# Patient Record
Sex: Female | Born: 1965 | Race: Black or African American | Hispanic: No | Marital: Married | Smoking: Former smoker
Health system: Southern US, Community
[De-identification: ages and names within clinical notes are randomized; demographics above are authoritative.]

## PROBLEM LIST (undated history)

## (undated) DIAGNOSIS — I10 Essential (primary) hypertension: Secondary | ICD-10-CM

## (undated) DIAGNOSIS — M5136 Other intervertebral disc degeneration, lumbar region: Secondary | ICD-10-CM

## (undated) DIAGNOSIS — E785 Hyperlipidemia, unspecified: Secondary | ICD-10-CM

## (undated) HISTORY — PX: ABDOMINAL HYSTERECTOMY: SHX81

## (undated) HISTORY — PX: DILATION AND CURETTAGE OF UTERUS: SHX78

## (undated) HISTORY — PX: OTHER SURGICAL HISTORY: SHX169

---

## 2000-02-28 ENCOUNTER — Encounter: Payer: Self-pay | Admitting: *Deleted

## 2000-02-28 ENCOUNTER — Inpatient Hospital Stay (HOSPITAL_COMMUNITY): Admission: AD | Admit: 2000-02-28 | Discharge: 2000-02-28 | Payer: Self-pay | Admitting: *Deleted

## 2000-03-15 ENCOUNTER — Other Ambulatory Visit: Admission: RE | Admit: 2000-03-15 | Discharge: 2000-03-15 | Payer: Self-pay | Admitting: Obstetrics

## 2000-03-20 ENCOUNTER — Ambulatory Visit (HOSPITAL_COMMUNITY): Admission: RE | Admit: 2000-03-20 | Discharge: 2000-03-20 | Payer: Self-pay | Admitting: Obstetrics

## 2000-03-20 ENCOUNTER — Encounter: Payer: Self-pay | Admitting: Obstetrics

## 2000-07-11 ENCOUNTER — Inpatient Hospital Stay (HOSPITAL_COMMUNITY): Admission: AD | Admit: 2000-07-11 | Discharge: 2000-07-11 | Payer: Self-pay | Admitting: Obstetrics

## 2014-07-23 ENCOUNTER — Other Ambulatory Visit: Payer: Self-pay | Admitting: Neurosurgery

## 2014-07-27 NOTE — Pre-Procedure Instructions (Signed)
Gloria SlickerConstance H Bryan  07/27/2014   Your procedure is scheduled on:  Friday, January 29th  [facility] at 10 AM.  Call this number if you have problems the morning of surgery: [phone]   Remember:   Do not eat food or drink liquids after midnight.   Take these medicines the morning of surgery with A SIP OF WATER:      Do not wear jewelry, make-up or nail polish.  Do not wear lotions, powders, or perfumes, deodorant.  Do not shave 48 hours prior to surgery. Men may shave face and neck.  Do not bring valuables to the hospital.  [facility]  for any belongings or valuables.               Contacts, dentures or bridgework may not be worn into surgery.  Leave suitcase in the car. After surgery it may be brought to your room.  For patients admitted to the hospital, discharge time is determined by you treatment team.               Patients discharged the day of surgery will not be allowed to drive home.   Please read over the following fact sheets that you were given: Pain Booklet, Coughing and Deep Breathing, MRSA Information and Surgical Site Infection Prevention  [place] - Preparing for Surgery  Before surgery, you can play an important role.  Because skin is not sterile, your skin needs to be as free of germs as possible.  You can reduce the number of germs on you skin by washing with CHG (chlorahexidine gluconate) soap before surgery.  CHG is an antiseptic cleaner which kills germs and bonds with the skin to continue killing germs even after washing.  Please DO NOT use if you have an allergy to CHG or antibacterial soaps.  If your skin becomes reddened/irritated stop using the CHG and inform your nurse when you arrive at Short Stay.  Do not shave (including legs and underarms) for at least 48 hours prior to the first CHG shower.  You may shave your face.  Please follow these instructions carefully:   1.  Shower with CHG Soap  the night before surgery and the morning of Surgery.  2.  If you choose to wash your hair, wash your hair first as usual with your normal shampoo.  3.  After you shampoo, rinse your hair and body thoroughly to remove the shampoo.  4.  Use CHG as you would any other liquid soap.  You can apply CHG directly to the skin and wash gently with scrungie or a clean washcloth.  5.  Apply the CHG Soap to your body ONLY FROM THE NECK DOWN.  Do not use on open wounds or open sores.  Avoid contact with your eyes, ears, mouth and genitals (private parts).  Wash genitals (private parts) with your normal soap.  6.  Wash thoroughly, paying special attention to the area where your surgery will be performed.  7.  Thoroughly rinse your body with warm water from the neck down.  8.  DO NOT shower/wash with your normal soap after using and rinsing off the CHG Soap.  9.  Pat yourself dry with a clean towel.            10.  Wear clean pajamas.            11.  Place clean sheets on your bed the night of your  first shower and do not sleep with pets.  Day of Surgery  Do not apply any lotions/deoderants the morning of surgery.  Please wear clean clothes to the hospital/surgery center.

## 2014-07-28 ENCOUNTER — Encounter (HOSPITAL_COMMUNITY)
Admission: RE | Admit: 2014-07-28 | Discharge: 2014-07-28 | Disposition: A | Discharge status: Home / Self Care | Payer: Managed Care, Other (non HMO) | Source: Ambulatory Visit | Attending: Neurosurgery | Admitting: Neurosurgery

## 2014-07-28 ENCOUNTER — Encounter (HOSPITAL_COMMUNITY): Payer: Self-pay

## 2014-07-28 DIAGNOSIS — M5020 Other cervical disc displacement, unspecified cervical region: Secondary | ICD-10-CM | POA: Diagnosis not present

## 2014-07-28 DIAGNOSIS — Z01810 Encounter for preprocedural cardiovascular examination: Secondary | ICD-10-CM | POA: Diagnosis not present

## 2014-07-28 DIAGNOSIS — Z01812 Encounter for preprocedural laboratory examination: Secondary | ICD-10-CM | POA: Insufficient documentation

## 2014-07-28 HISTORY — DX: Hyperlipidemia, unspecified: E78.5

## 2014-07-28 HISTORY — DX: Other intervertebral disc degeneration, lumbar region: M51.36

## 2014-07-28 HISTORY — DX: Essential (primary) hypertension: I10

## 2014-07-28 LAB — BASIC METABOLIC PANEL
Anion gap: 9 (ref 5–15)
BUN: 11 mg/dL (ref 6–23)
CALCIUM: 9.3 mg/dL (ref 8.4–10.5)
CHLORIDE: 109 mmol/L (ref 96–112)
CO2: 23 mmol/L (ref 19–32)
Creatinine, Ser: 0.63 mg/dL (ref 0.50–1.10)
GFR calc Af Amer: >90 mL/min (ref >90)
GFR calc non Af Amer: >90 mL/min (ref >90)
GLUCOSE: 109 mg/dL — AB (ref 70–99)
Potassium: 3.9 mmol/L (ref 3.5–5.1)
SODIUM: 141 mmol/L (ref 135–145)

## 2014-07-28 LAB — CBC WITH DIFFERENTIAL/PLATELET
BASOS PCT: 0 % (ref 0–1)
Basophils Absolute: 0 10*3/uL (ref 0.0–0.1)
EOS ABS: 0.1 10*3/uL (ref 0.0–0.7)
EOS PCT: 1 % (ref 0–5)
HEMATOCRIT: 39.8 % (ref 36.0–46.0)
Hemoglobin: 13.6 g/dL (ref 12.0–15.0)
Lymphocytes Relative: 37 % (ref 12–46)
Lymphs Abs: 2.5 10*3/uL (ref 0.7–4.0)
MCH: 31.8 pg (ref 26.0–34.0)
MCHC: 34.2 g/dL (ref 30.0–36.0)
MCV: 93 fL (ref 78.0–100.0)
MONO ABS: 0.3 10*3/uL (ref 0.1–1.0)
Monocytes Relative: 5 % (ref 3–12)
NEUTROS PCT: 57 % (ref 43–77)
Neutro Abs: 3.9 10*3/uL (ref 1.7–7.7)
Platelets: 234 10*3/uL (ref 150–400)
RBC: 4.28 MIL/uL (ref 3.87–5.11)
RDW: 12.6 % (ref 11.5–15.5)
WBC: 6.8 10*3/uL (ref 4.0–10.5)

## 2014-07-28 LAB — SURGICAL PCR SCREEN
MRSA, PCR: NEGATIVE
STAPHYLOCOCCUS AUREUS: NEGATIVE

## 2014-07-28 NOTE — Progress Notes (Signed)
Primary - dr. Philmore Palimatthew philip No cardiologist No prior cardiac testing  Patient unable to afford medications. So does not currently take any prescribed medications

## 2014-07-31 NOTE — Progress Notes (Signed)
Spoke with pt and informed her of surgical time change and to arrive at 0900.  Reviewed pre op instructions with state understanding.

## 2014-08-02 MED ORDER — CEFAZOLIN SODIUM-DEXTROSE 2-3 GM-% IV SOLR
2.0000 g | INTRAVENOUS | Status: AC
  Administered 2014-08-03: 2 g via INTRAVENOUS
  Filled 2014-08-02: qty 50

## 2014-08-02 MED ORDER — DEXAMETHASONE SODIUM PHOSPHATE 10 MG/ML IJ SOLN
10.0000 mg | INTRAMUSCULAR | Status: AC
  Administered 2014-08-03: 10 mg via INTRAVENOUS
  Filled 2014-08-02: qty 1

## 2014-08-03 ENCOUNTER — Encounter (HOSPITAL_COMMUNITY): Payer: Self-pay | Admitting: *Deleted

## 2014-08-03 ENCOUNTER — Inpatient Hospital Stay (HOSPITAL_COMMUNITY): Payer: Worker's Compensation | Admitting: Certified Registered Nurse Anesthetist

## 2014-08-03 ENCOUNTER — Inpatient Hospital Stay (HOSPITAL_COMMUNITY)
Admission: RE | Admit: 2014-08-03 | Discharge: 2014-08-04 | DRG: 473 | Disposition: A | Discharge status: Home / Self Care | Payer: Worker's Compensation | Source: Ambulatory Visit | Attending: Neurosurgery | Admitting: Neurosurgery

## 2014-08-03 ENCOUNTER — Inpatient Hospital Stay (HOSPITAL_COMMUNITY): Payer: Worker's Compensation

## 2014-08-03 ENCOUNTER — Encounter (HOSPITAL_COMMUNITY)
Admission: RE | Disposition: A | Discharge status: Home / Self Care | Payer: Self-pay | Source: Ambulatory Visit | Attending: Neurosurgery

## 2014-08-03 DIAGNOSIS — I10 Essential (primary) hypertension: Secondary | ICD-10-CM | POA: Diagnosis present

## 2014-08-03 DIAGNOSIS — M4712 Other spondylosis with myelopathy, cervical region: Secondary | ICD-10-CM | POA: Diagnosis present

## 2014-08-03 DIAGNOSIS — M4802 Spinal stenosis, cervical region: Secondary | ICD-10-CM | POA: Diagnosis present

## 2014-08-03 DIAGNOSIS — Z87891 Personal history of nicotine dependence: Secondary | ICD-10-CM | POA: Diagnosis not present

## 2014-08-03 DIAGNOSIS — E785 Hyperlipidemia, unspecified: Secondary | ICD-10-CM | POA: Diagnosis present

## 2014-08-03 DIAGNOSIS — M542 Cervicalgia: Secondary | ICD-10-CM | POA: Diagnosis present

## 2014-08-03 DIAGNOSIS — M4322 Fusion of spine, cervical region: Secondary | ICD-10-CM

## 2014-08-03 HISTORY — PX: ANTERIOR CERVICAL DECOMPRESSION/DISCECTOMY FUSION 4 LEVELS: SHX5556

## 2014-08-03 SURGERY — ANTERIOR CERVICAL DECOMPRESSION/DISCECTOMY FUSION 4 LEVELS
Anesthesia: General | Site: Neck

## 2014-08-03 MED ORDER — LACTATED RINGERS IV SOLN
INTRAVENOUS | Status: DC
  Administered 2014-08-03: 10:00:00 via INTRAVENOUS

## 2014-08-03 MED ORDER — HYDROMORPHONE HCL 1 MG/ML IJ SOLN
0.5000 mg | INTRAMUSCULAR | Status: DC | PRN
  Administered 2014-08-03: 1 mg via INTRAVENOUS
  Filled 2014-08-03: qty 1

## 2014-08-03 MED ORDER — ARTIFICIAL TEARS OP OINT
TOPICAL_OINTMENT | OPHTHALMIC | Status: DC | PRN
  Administered 2014-08-03: 1 via OPHTHALMIC

## 2014-08-03 MED ORDER — SUCCINYLCHOLINE CHLORIDE 20 MG/ML IJ SOLN
INTRAMUSCULAR | Status: AC
  Filled 2014-08-03: qty 1

## 2014-08-03 MED ORDER — PROPOFOL 10 MG/ML IV BOLUS
INTRAVENOUS | Status: AC
  Filled 2014-08-03: qty 20

## 2014-08-03 MED ORDER — STERILE WATER FOR INJECTION IJ SOLN
INTRAMUSCULAR | Status: AC
  Filled 2014-08-03: qty 10

## 2014-08-03 MED ORDER — PROMETHAZINE HCL 25 MG/ML IJ SOLN: 6.2500 mg | INTRAMUSCULAR | Status: DC | PRN

## 2014-08-03 MED ORDER — THROMBIN 20000 UNITS EX KIT
PACK | CUTANEOUS | Status: DC | PRN
  Administered 2014-08-03: 20 mL via TOPICAL

## 2014-08-03 MED ORDER — MENTHOL 3 MG MT LOZG: 1.0000 | LOZENGE | OROMUCOSAL | Status: DC | PRN

## 2014-08-03 MED ORDER — SODIUM CHLORIDE 0.9 % IJ SOLN
3.0000 mL | Freq: Two times a day (BID) | INTRAMUSCULAR | Status: DC
  Administered 2014-08-03 (×2): 3 mL via INTRAVENOUS

## 2014-08-03 MED ORDER — CEFAZOLIN SODIUM 1-5 GM-% IV SOLN
1.0000 g | Freq: Three times a day (TID) | INTRAVENOUS | Status: AC
  Administered 2014-08-03 – 2014-08-04 (×2): 1 g via INTRAVENOUS
  Filled 2014-08-03 (×2): qty 50

## 2014-08-03 MED ORDER — PHENYLEPHRINE HCL 10 MG/ML IJ SOLN
INTRAMUSCULAR | Status: DC | PRN
  Administered 2014-08-03: 120 ug via INTRAVENOUS
  Administered 2014-08-03 (×2): 80 ug via INTRAVENOUS
  Administered 2014-08-03: 120 ug via INTRAVENOUS

## 2014-08-03 MED ORDER — GLYCOPYRROLATE 0.2 MG/ML IJ SOLN
INTRAMUSCULAR | Status: AC
  Filled 2014-08-03: qty 3

## 2014-08-03 MED ORDER — LIDOCAINE HCL (CARDIAC) 20 MG/ML IV SOLN
INTRAVENOUS | Status: AC
  Filled 2014-08-03: qty 5

## 2014-08-03 MED ORDER — VECURONIUM BROMIDE 10 MG IV SOLR
INTRAVENOUS | Status: AC
  Filled 2014-08-03: qty 10

## 2014-08-03 MED ORDER — MIDAZOLAM HCL 2 MG/2ML IJ SOLN
INTRAMUSCULAR | Status: AC
  Filled 2014-08-03: qty 2

## 2014-08-03 MED ORDER — LIDOCAINE HCL (CARDIAC) 20 MG/ML IV SOLN
INTRAVENOUS | Status: DC | PRN
  Administered 2014-08-03: 100 mg via INTRAVENOUS

## 2014-08-03 MED ORDER — CYCLOBENZAPRINE HCL 10 MG PO TABS
10.0000 mg | ORAL_TABLET | Freq: Three times a day (TID) | ORAL | Status: DC | PRN
  Administered 2014-08-03 – 2014-08-04 (×3): 10 mg via ORAL
  Filled 2014-08-03 (×2): qty 1

## 2014-08-03 MED ORDER — OXYCODONE-ACETAMINOPHEN 5-325 MG PO TABS
1.0000 | ORAL_TABLET | ORAL | Status: DC | PRN
  Administered 2014-08-03 – 2014-08-04 (×3): 2 via ORAL
  Filled 2014-08-03 (×3): qty 2

## 2014-08-03 MED ORDER — MIDAZOLAM HCL 5 MG/5ML IJ SOLN
INTRAMUSCULAR | Status: DC | PRN
  Administered 2014-08-03: 2 mg via INTRAVENOUS

## 2014-08-03 MED ORDER — KETOROLAC TROMETHAMINE 30 MG/ML IJ SOLN
INTRAMUSCULAR | Status: AC
  Filled 2014-08-03: qty 1

## 2014-08-03 MED ORDER — OXYCODONE HCL 5 MG/5ML PO SOLN: 5.0000 mg | Freq: Once | ORAL | Status: AC | PRN

## 2014-08-03 MED ORDER — CYCLOBENZAPRINE HCL 10 MG PO TABS
ORAL_TABLET | ORAL | Status: AC
  Filled 2014-08-03: qty 1

## 2014-08-03 MED ORDER — PHENYLEPHRINE HCL 10 MG/ML IJ SOLN
10.0000 mg | INTRAVENOUS | Status: DC | PRN
  Administered 2014-08-03: 40 ug/min via INTRAVENOUS

## 2014-08-03 MED ORDER — HYDROMORPHONE HCL 1 MG/ML IJ SOLN
INTRAMUSCULAR | Status: AC
  Filled 2014-08-03: qty 1

## 2014-08-03 MED ORDER — KETOROLAC TROMETHAMINE 30 MG/ML IJ SOLN
30.0000 mg | Freq: Once | INTRAMUSCULAR | Status: AC | PRN
  Administered 2014-08-03: 30 mg via INTRAVENOUS

## 2014-08-03 MED ORDER — SENNA 8.6 MG PO TABS
1.0000 | ORAL_TABLET | Freq: Two times a day (BID) | ORAL | Status: DC
  Administered 2014-08-03: 8.6 mg via ORAL
  Filled 2014-08-03 (×3): qty 1

## 2014-08-03 MED ORDER — 0.9 % SODIUM CHLORIDE (POUR BTL) OPTIME
TOPICAL | Status: DC | PRN
  Administered 2014-08-03: 1000 mL

## 2014-08-03 MED ORDER — OXYCODONE HCL 5 MG PO TABS
ORAL_TABLET | ORAL | Status: AC
  Filled 2014-08-03: qty 1

## 2014-08-03 MED ORDER — FENTANYL CITRATE 0.05 MG/ML IJ SOLN
INTRAMUSCULAR | Status: AC
  Filled 2014-08-03: qty 5

## 2014-08-03 MED ORDER — ACETAMINOPHEN 650 MG RE SUPP: 650.0000 mg | RECTAL | Status: DC | PRN

## 2014-08-03 MED ORDER — PROPOFOL 10 MG/ML IV BOLUS
INTRAVENOUS | Status: DC | PRN
  Administered 2014-08-03: 140 mg via INTRAVENOUS

## 2014-08-03 MED ORDER — THROMBIN 5000 UNITS EX SOLR
CUTANEOUS | Status: DC | PRN
  Administered 2014-08-03: 10 mL via TOPICAL

## 2014-08-03 MED ORDER — HYDROMORPHONE HCL 1 MG/ML IJ SOLN
0.2500 mg | INTRAMUSCULAR | Status: DC | PRN
  Administered 2014-08-03 (×2): 0.5 mg via INTRAVENOUS

## 2014-08-03 MED ORDER — OXYCODONE HCL 5 MG PO TABS
5.0000 mg | ORAL_TABLET | Freq: Once | ORAL | Status: AC | PRN
  Administered 2014-08-03: 5 mg via ORAL

## 2014-08-03 MED ORDER — ONDANSETRON HCL 4 MG/2ML IJ SOLN
INTRAMUSCULAR | Status: AC
  Filled 2014-08-03: qty 2

## 2014-08-03 MED ORDER — ACETAMINOPHEN 325 MG PO TABS: 650.0000 mg | ORAL_TABLET | ORAL | Status: DC | PRN

## 2014-08-03 MED ORDER — CEFAZOLIN SODIUM-DEXTROSE 2-3 GM-% IV SOLR
INTRAVENOUS | Status: AC
  Filled 2014-08-03: qty 50

## 2014-08-03 MED ORDER — ONDANSETRON HCL 4 MG/2ML IJ SOLN
INTRAMUSCULAR | Status: DC | PRN
Start: 2014-08-03 — End: 2014-08-03
  Administered 2014-08-03: 4 mg via INTRAVENOUS

## 2014-08-03 MED ORDER — ONDANSETRON HCL 4 MG/2ML IJ SOLN: 4.0000 mg | INTRAMUSCULAR | Status: DC | PRN

## 2014-08-03 MED ORDER — VECURONIUM BROMIDE 10 MG IV SOLR
INTRAVENOUS | Status: DC | PRN
Start: 2014-08-03 — End: 2014-08-03
  Administered 2014-08-03 (×2): 2 mg via INTRAVENOUS
  Administered 2014-08-03: 4 mg via INTRAVENOUS

## 2014-08-03 MED ORDER — SODIUM CHLORIDE 0.9 % IJ SOLN: 3.0000 mL | INTRAMUSCULAR | Status: DC | PRN

## 2014-08-03 MED ORDER — GLYCOPYRROLATE 0.2 MG/ML IJ SOLN
INTRAMUSCULAR | Status: DC | PRN
  Administered 2014-08-03: .8 mg via INTRAVENOUS

## 2014-08-03 MED ORDER — ALUM & MAG HYDROXIDE-SIMETH 200-200-20 MG/5ML PO SUSP
30.0000 mL | Freq: Four times a day (QID) | ORAL | Status: DC | PRN
Start: 2014-08-03 — End: 2014-08-04

## 2014-08-03 MED ORDER — ARTIFICIAL TEARS OP OINT
TOPICAL_OINTMENT | OPHTHALMIC | Status: AC
Start: 2014-08-03 — End: 2014-08-03
  Filled 2014-08-03: qty 3.5

## 2014-08-03 MED ORDER — FENTANYL CITRATE 0.05 MG/ML IJ SOLN
INTRAMUSCULAR | Status: DC | PRN
  Administered 2014-08-03 (×4): 50 ug via INTRAVENOUS
  Administered 2014-08-03: 150 ug via INTRAVENOUS
  Administered 2014-08-03 (×3): 50 ug via INTRAVENOUS

## 2014-08-03 MED ORDER — HYDROCODONE-ACETAMINOPHEN 5-325 MG PO TABS: 1.0000 | ORAL_TABLET | ORAL | Status: DC | PRN

## 2014-08-03 MED ORDER — NEOSTIGMINE METHYLSULFATE 10 MG/10ML IV SOLN
INTRAVENOUS | Status: AC
  Filled 2014-08-03: qty 1

## 2014-08-03 MED ORDER — LACTATED RINGERS IV SOLN
INTRAVENOUS | Status: DC | PRN
  Administered 2014-08-03 (×3): via INTRAVENOUS

## 2014-08-03 MED ORDER — NEOSTIGMINE METHYLSULFATE 10 MG/10ML IV SOLN
INTRAVENOUS | Status: DC | PRN
Start: 2014-08-03 — End: 2014-08-03
  Administered 2014-08-03: 5 mg via INTRAVENOUS

## 2014-08-03 MED ORDER — ROCURONIUM BROMIDE 100 MG/10ML IV SOLN
INTRAVENOUS | Status: DC | PRN
  Administered 2014-08-03: 50 mg via INTRAVENOUS

## 2014-08-03 MED ORDER — SODIUM CHLORIDE 0.9 % IR SOLN
Status: DC | PRN
  Administered 2014-08-03: 500 mL

## 2014-08-03 MED ORDER — PHENOL 1.4 % MT LIQD: 1.0000 | OROMUCOSAL | Status: DC | PRN

## 2014-08-03 SURGICAL SUPPLY — 64 items
APL SKNCLS STERI-STRIP NONHPOA (GAUZE/BANDAGES/DRESSINGS) ×1
BAG DECANTER FOR FLEXI CONT (MISCELLANEOUS) ×3 IMPLANT
BENZOIN TINCTURE PRP APPL 2/3 (GAUZE/BANDAGES/DRESSINGS) ×3 IMPLANT
BRUSH SCRUB EZ PLAIN DRY (MISCELLANEOUS) ×3 IMPLANT
BUR MATCHSTICK NEURO 3.0 LAGG (BURR) ×3 IMPLANT
CAGE PEEK 6X14X11 (Cage) ×3 IMPLANT
CAGE SPNL 11X14X6XRADOPQ (Cage) IMPLANT
CANISTER SUCT 3000ML (MISCELLANEOUS) ×3 IMPLANT
CLOSURE WOUND 1/2 X4 (GAUZE/BANDAGES/DRESSINGS) ×1
CONT SPEC 4OZ CLIKSEAL STRL BL (MISCELLANEOUS) ×3 IMPLANT
DRAPE C-ARM 42X72 X-RAY (DRAPES) ×6 IMPLANT
DRAPE LAPAROTOMY 100X72 PEDS (DRAPES) ×3 IMPLANT
DRAPE MICROSCOPE LEICA (MISCELLANEOUS) ×3 IMPLANT
DRAPE POUCH INSTRU U-SHP 10X18 (DRAPES) ×3 IMPLANT
DRILL BIT (BIT) ×2 IMPLANT
DURAPREP 6ML APPLICATOR 50/CS (WOUND CARE) ×3 IMPLANT
ELECT COATED BLADE 2.86 ST (ELECTRODE) ×3 IMPLANT
ELECT REM PT RETURN 9FT ADLT (ELECTROSURGICAL) ×3
ELECTRODE REM PT RTRN 9FT ADLT (ELECTROSURGICAL) ×1 IMPLANT
GAUZE SPONGE 4X4 12PLY STRL (GAUZE/BANDAGES/DRESSINGS) ×3 IMPLANT
GAUZE SPONGE 4X4 16PLY XRAY LF (GAUZE/BANDAGES/DRESSINGS) ×2 IMPLANT
GLOVE ECLIPSE 9.0 STRL (GLOVE) ×1 IMPLANT
GLOVE EXAM NITRILE LRG STRL (GLOVE) IMPLANT
GLOVE EXAM NITRILE MD LF STRL (GLOVE) IMPLANT
GLOVE EXAM NITRILE XL STR (GLOVE) IMPLANT
GLOVE EXAM NITRILE XS STR PU (GLOVE) IMPLANT
GLOVE SS N UNI LF 7.0 STRL (GLOVE) ×2 IMPLANT
GLOVE SS N UNI LF 7.5 STRL (GLOVE) ×2 IMPLANT
GLOVE SS N UNI LF 8.0 STRL (GLOVE) ×2 IMPLANT
GLOVE SS N UNI LF 8.5 STRL (GLOVE) ×2 IMPLANT
GLOVE SS N UNI LF STER SZ 9 (GLOVE) ×2 IMPLANT
GOWN STRL REUS W/ TWL LRG LVL3 (GOWN DISPOSABLE) IMPLANT
GOWN STRL REUS W/ TWL XL LVL3 (GOWN DISPOSABLE) IMPLANT
GOWN STRL REUS W/TWL 2XL LVL3 (GOWN DISPOSABLE) IMPLANT
GOWN STRL REUS W/TWL LRG LVL3 (GOWN DISPOSABLE)
GOWN STRL REUS W/TWL XL LVL3 (GOWN DISPOSABLE)
HALTER HD/CHIN CERV TRACTION D (MISCELLANEOUS) ×3 IMPLANT
KIT BASIN OR (CUSTOM PROCEDURE TRAY) ×3 IMPLANT
KIT ROOM TURNOVER OR (KITS) ×3 IMPLANT
NDL SPNL 20GX3.5 QUINCKE YW (NEEDLE) ×1 IMPLANT
NEEDLE SPNL 20GX3.5 QUINCKE YW (NEEDLE) ×3 IMPLANT
NS IRRIG 1000ML POUR BTL (IV SOLUTION) ×3 IMPLANT
PACK LAMINECTOMY NEURO (CUSTOM PROCEDURE TRAY) ×3 IMPLANT
PAD ARMBOARD 7.5X6 YLW CONV (MISCELLANEOUS) ×9 IMPLANT
PATTIES SURGICAL 1X1 (DISPOSABLE) ×2 IMPLANT
PEEK CAGE 7X14X11 (Cage) ×6 IMPLANT
PLATE 4 75XNS SPNE CVD ANT T (Plate) IMPLANT
PLATE 4 ATLANTIS TRANS (Plate) ×3 IMPLANT
RUBBERBAND STERILE (MISCELLANEOUS) ×6 IMPLANT
SCREW ST FIX 4 ATL 3120213 (Screw) ×20 IMPLANT
SPONGE INTESTINAL PEANUT (DISPOSABLE) ×3 IMPLANT
SPONGE SURGIFOAM ABS GEL SZ50 (HEMOSTASIS) ×3 IMPLANT
STRIP CLOSURE SKIN 1/2X4 (GAUZE/BANDAGES/DRESSINGS) ×2 IMPLANT
SUT PDS AB 5-0 P3 18 (SUTURE) ×3 IMPLANT
SUT VIC AB 3-0 SH 8-18 (SUTURE) ×3 IMPLANT
SYR 20ML ECCENTRIC (SYRINGE) ×3 IMPLANT
TAPE CLOTH 4X10 WHT NS (GAUZE/BANDAGES/DRESSINGS) ×3 IMPLANT
TAPE CLOTH SURG 4X10 WHT LF (GAUZE/BANDAGES/DRESSINGS) ×2 IMPLANT
TAPE STRIPS DRAPE STRL (GAUZE/BANDAGES/DRESSINGS) ×2 IMPLANT
TOWEL OR 17X24 6PK STRL BLUE (TOWEL DISPOSABLE) ×3 IMPLANT
TOWEL OR 17X26 10 PK STRL BLUE (TOWEL DISPOSABLE) ×3 IMPLANT
TRAP SPECIMEN MUCOUS 40CC (MISCELLANEOUS) ×3 IMPLANT
TRAY FOLEY BAG SILVER LF 16FR (CATHETERS) ×2 IMPLANT
WATER STERILE IRR 1000ML POUR (IV SOLUTION) ×3 IMPLANT

## 2014-08-03 NOTE — Anesthesia Postprocedure Evaluation (Signed)
  Anesthesia Post-op Note  Patient: Gloria Bryan  Procedure(s) Performed: Procedure(s) with comments: ANTERIOR CERVICAL DECOMPRESSION/DISCECTOMY FUSION 4 LEVELS (N/A) - ANTERIOR CERVICAL DECOMPRESSION/DISCECTOMY FUSION 4 LEVELS C3-7  Patient Location: PACU  Anesthesia Type:General  Level of Consciousness: awake and alert   Airway and Oxygen Therapy: Patient Spontanous Breathing  Post-op Pain: none  Post-op Assessment: Post-op Vital signs reviewed  Post-op Vital Signs: Reviewed  Last Vitals:  Filed Vitals:   08/03/14 1500  BP:   Pulse: 94  Temp:   Resp: 13    Complications: No apparent anesthesia complications

## 2014-08-03 NOTE — H&P (Signed)
Gloria Bryan is an 49 y.o. female.   Chief Complaint: Neck and left shoulder pain HPI: 49 year old female with severe neck pain with radiation into her left shoulder also with left upper and lower extremity numbness and weakness. Workup has demonstrated evidence of severe disc disease with stenosis and spinal cord compression at C3-4, C5-6 and C6-7. She also has some spondylitic narrowing at C4-5. Patient has failed conservative management and presents now for 4 level anterior cervical decompression and fusion  Past Medical History  Diagnosis Date  . Hypertension     cannot afford medication  . DDD (degenerative disc disease), lumbar   . Hyperlipidemia     Past Surgical History  Procedure Laterality Date  . Abdominal hysterectomy    . Dilation and curettage of uterus    . Goiter removal      History reviewed. No pertinent family history. Social History:  reports that she has quit smoking. Her smoking use included E-cigarettes. She does not have any smokeless tobacco history on file. She reports that she does not drink alcohol or use illicit drugs.  Allergies:  Allergies  Allergen Reactions  . Latex Rash  . Penicillins Rash  . Sulfa Antibiotics Rash    No prescriptions prior to admission    No results found for this or any previous visit (from the past 48 hour(s)). No results found.  Review of Systems  Respiratory: Negative.   All other systems reviewed and are negative.   Blood pressure 123/70, pulse 93, temperature 98 F (36.7 C), temperature source Oral, resp. rate 20, height 5\' 3"  (1.6 m), weight 93.611 kg (206 lb 6 oz), SpO2 97 %. Physical Exam  Constitutional: She is oriented to person, place, and time. She appears well-developed and well-nourished.  HENT:  Head: Normocephalic and atraumatic.  Right Ear: External ear normal.  Left Ear: External ear normal.  Nose: Nose normal.  Mouth/Throat: Oropharynx is clear and moist.  Eyes: Conjunctivae and EOM are  normal. Pupils are equal, round, and reactive to light.  Neck: Normal range of motion. Neck supple. No tracheal deviation present. No thyromegaly present.  Cardiovascular: Normal rate, regular rhythm, normal heart sounds and intact distal pulses.  Exam reveals no friction rub.   No murmur heard. Respiratory: Effort normal and breath sounds normal. No respiratory distress. She has no wheezes.  GI: Soft. Bowel sounds are normal. She exhibits no distension. There is no tenderness.  Musculoskeletal: Normal range of motion. She exhibits no edema or tenderness.  Neurological: She is alert and oriented to person, place, and time. She has normal reflexes. She displays normal reflexes. No cranial nerve deficit. She exhibits normal muscle tone. Coordination normal.  Skin: Skin is warm and dry. No rash noted. No erythema. No pallor.  Psychiatric: She has a normal mood and affect. Her behavior is normal. Judgment and thought content normal.     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 Plan C3-4, C4-5, C5-6, C6-7 anterior cervical discectomy and interbody fusion utilizing interbody peek cage, locally harvested autograft, and anterior plate instrumentation. Risks and benefits of explained. Patient wishes to proceed.  Akshaj Besancon A 08/03/2014, 10:09 AM

## 2014-08-03 NOTE — Anesthesia Procedure Notes (Signed)
Procedure Name: Intubation Date/Time: 08/03/2014 10:39 AM Performed by: Orvilla FusATO, Edras Wilford A Pre-anesthesia Checklist: Patient identified, Timeout performed, Emergency Drugs available, Suction available and Patient being monitored Patient Re-evaluated:Patient Re-evaluated prior to inductionOxygen Delivery Method: Circle system utilized Preoxygenation: Pre-oxygenation with 100% oxygen Intubation Type: IV induction Ventilation: Mask ventilation without difficulty and Oral airway inserted - appropriate to patient size Grade View: Grade I Tube type: Oral Tube size: 7.0 mm Number of attempts: 1 Airway Equipment and Method: Video-laryngoscopy and Rigid stylet Placement Confirmation: ETT inserted through vocal cords under direct vision,  breath sounds checked- equal and bilateral and positive ETCO2 Secured at: 21 cm Tube secured with: Tape Dental Injury: Teeth and Oropharynx as per pre-operative assessment

## 2014-08-03 NOTE — Brief Op Note (Signed)
08/03/2014  1:29 PM  PATIENT:  Gloria Bryan  49 y.o. female  PRE-OPERATIVE DIAGNOSIS:  HNP  POST-OPERATIVE DIAGNOSIS:  herniated nucleus pulposus  PROCEDURE:  Procedure(s) with comments: ANTERIOR CERVICAL DECOMPRESSION/DISCECTOMY FUSION 4 LEVELS (N/A) - ANTERIOR CERVICAL DECOMPRESSION/DISCECTOMY FUSION 4 LEVELS C3-7  SURGEON:  Surgeon(s) and Role:    * Temple PaciniHenry A Derold Dorsch, MD - Primary    * Mariam DollarGary P Cram, MD - Assisting  PHYSICIAN ASSISTANT:   ASSISTANTS:    ANESTHESIA:   general  EBL:  Total I/O In: 2000 [I.V.:2000] Out: 400 [Urine:100; Blood:300]  BLOOD ADMINISTERED:none  DRAINS: (med) Hemovact drain(s) in the prevertebral space with  Suction Open   LOCAL MEDICATIONS USED:  none SPECIMEN:  No Specimen  DISPOSITION OF SPECIMEN:  N/A  COUNTS:  YES  TOURNIQUET:  * No tourniquets in log *  DICTATION: .Dragon Dictation  PLAN OF CARE: Admit to inpatient   PATIENT DISPOSITION:  PACU - hemodynamically stable.   Delay start of Pharmacological VTE agent (>24hrs) due to surgical blood loss or risk of bleeding: yes

## 2014-08-03 NOTE — Anesthesia Preprocedure Evaluation (Addendum)
Anesthesia Evaluation  Patient identified by MRN, date of birth, ID band Patient awake    Reviewed: Allergy & Precautions, NPO status , Patient's Chart, lab work & pertinent test results  Airway Mallampati: II  TM Distance: >3 FB Neck ROM: Limited    Dental   Pulmonary former smoker,          Cardiovascular negative cardio ROS      Neuro/Psych negative neurological ROS     GI/Hepatic negative GI ROS, Neg liver ROS,   Endo/Other  Morbid obesity  Renal/GU negative Renal ROS     Musculoskeletal negative musculoskeletal ROS (+)   Abdominal   Peds  Hematology negative hematology ROS (+)   Anesthesia Other Findings   Reproductive/Obstetrics                            Anesthesia Physical Anesthesia Plan  ASA: II  Anesthesia Plan: General   Post-op Pain Management:    Induction: Intravenous  Airway Management Planned: Oral ETT  Additional Equipment:   Intra-op Plan:   Post-operative Plan: Extubation in OR  Informed Consent: I have reviewed the patients History and Physical, chart, labs and discussed the procedure including the risks, benefits and alternatives for the proposed anesthesia with the patient or authorized representative who has indicated his/her understanding and acceptance.   Dental advisory given  Plan Discussed with: CRNA  Anesthesia Plan Comments:         Anesthesia Quick Evaluation

## 2014-08-03 NOTE — Plan of Care (Signed)
Problem: Consults Goal: Diagnosis - Spinal Surgery Outcome: Completed/Met Date Met:  08/03/14 C3-7 ANTERIOR CERVICAL FUSION

## 2014-08-03 NOTE — Transfer of Care (Signed)
Immediate Anesthesia Transfer of Care Note  Patient: Deanne CofferConstance H Harville  Procedure(s) Performed: Procedure(s) with comments: ANTERIOR CERVICAL DECOMPRESSION/DISCECTOMY FUSION 4 LEVELS (N/A) - ANTERIOR CERVICAL DECOMPRESSION/DISCECTOMY FUSION 4 LEVELS C3-7  Patient Location: PACU  Anesthesia Type:General  Level of Consciousness: awake, alert  and oriented  Airway & Oxygen Therapy: Patient Spontanous Breathing and Patient connected to nasal cannula oxygen  Post-op Assessment: Report given to RN, Post -op Vital signs reviewed and stable and Patient moving all extremities X 4  Post vital signs: Reviewed and stable  Last Vitals:  Filed Vitals:   08/03/14 0854  BP: 123/70  Pulse: 93  Temp: 36.7 C  Resp: 20    Complications: No apparent anesthesia complications

## 2014-08-03 NOTE — Progress Notes (Signed)
Utilization review completed.  

## 2014-08-03 NOTE — Op Note (Signed)
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  Asst. Surgeon: Wynetta Emeryram  Anesthesia: General  Indication: 49 year old female with neck and bilateral upper extremities left greater than right. Workup demonstrates evidence of marked spinal stenosis with spinal cord compression secondary to disc herniations and osteophytic disease at C3-4 C4-5 C5-6 and C6-7. Patient presents now for 4 level anterior cervical decompression and fusion in hopes of improving her symptoms.  Operative note: After induction of anesthesia, patient positioned supine with neck slightly extended and held place of halter traction. Patient's anterior cervical region was prepped and draped sterilely. Incision made overlying the C5 vertebral body. This carried down sharply to the platysma. Platysma was then divided vertically and dissection proceeds along the medial border of the sternocleidomastoid muscle and carotid sheath. Trachea and esophagus are mobilized and retracted towards the left. Prevertebral fascia was stripped off the anterior spinal column. Longus cole muscles elevated bilaterally. Retractor placed. Fluoroscopy used. All 4 levels confirmed. Disc spaces incised and then discectomies were performed using pituitary rongeurs, for tobacco or Carlen curettes, Kerrison rongeurs, and the high-speed drill. All elements the disc removed down to level posterior annulus. Microscope was brought field use at the remainder of discectomy. Remaining aspects of annulus and osteophytes removed using a high-speed drill down to level posterior longitudinal ligament. Posterior longitudinal was  elevated and resected piecemeal fashion. Underlying thecal sac was identified. Wide central decompression and perform undercutting bodies of C3 and C4. Decompression then proceeded out each neural foramen. Anterior foraminotomies were performed along the course the exiting nerve roots bilaterally. The decompression was achieved without incidence of complication or injury to thecal sac and nerve roots. Wound was then irrigated out solution. Gelfoam was placed topically. Procedure then repeated at C4-5, C5-6, and C6-7 all without combination. Findings during the surgery were of a leftward disc herniation at C3-4 broad-based spondylitic trusion C4-5-1 large central protrusion at C5-6 and a central protrusion at C6-7. [employer] anatomic peek cages were then packed with morselized autograft which was harvested during the discectomy. This is then impacted into place at all 4 levels. All 4 cages were recessed slightly from the anterior cortical margin. A 75 mm Atlantis translational plate was then placed over the C3, C4, C5, C6, C7 levels. This an attachment or for scapular guidance heme 13 motor fixed angle screws to reach it all 5 levels. All 10 screws were given a final tightening found be solidly within the bone. Locking screws were engaged at all levels. Final images revealed good position the bone graft and hardware at proper upper level with normal alignment is fine. Wound is then irrigated one final time. Hemostasis was assured with bipolar chart. A medium Hemovac drain was left in the prevertebral space. Wounds and close in layers with Vicryl sutures. Steri-Strips and sterile dressing were applied. There were no apparent Occasions. Patient tolerated the procedure well and she returns to the recovery room postop.

## 2014-08-04 ENCOUNTER — Encounter (HOSPITAL_COMMUNITY): Payer: Self-pay | Admitting: Neurosurgery

## 2014-08-04 MED ORDER — CYCLOBENZAPRINE HCL 10 MG PO TABS: 10.0000 mg | ORAL_TABLET | Freq: Three times a day (TID) | ORAL | Status: AC | PRN

## 2014-08-04 MED ORDER — HYDROCODONE-ACETAMINOPHEN 5-325 MG PO TABS: 1.0000 | ORAL_TABLET | ORAL | Status: AC | PRN

## 2014-08-04 NOTE — Discharge Summary (Signed)
Physician Discharge Summary  Patient ID: Gloria CofferConstance H Bryan MRN: [id number] DOB/AGE: 1966/01/27 49 y.o.  Admit date: 08/03/2014 Discharge date: 08/04/2014  Admission Diagnoses:  Discharge Diagnoses:  Active Problems:   Cervical stenosis of spine   Discharged Condition: good  Hospital Course: Patient did hospital where she underwent uncomplicated 4 level anterior cervical decompression infusion. Postoperative she is doing well. Neck and upper chamois symptoms much improved. Up ambulating without difficulty. Ready for discharge home.  Consults:   Significant Diagnostic Studies:   Treatments:   Discharge Exam: Blood pressure 132/86, pulse 101, temperature 98.6 F (37 C), temperature source Oral, resp. rate 20, height 5\' 3"  (1.6 m), weight 93.611 kg (206 lb 6 oz), SpO2 100 %. Awake and alert. Oriented and appropriate. Motor and sensory function intact. Wound clean and dry. Chest and abdomen benign.  Disposition: Final discharge disposition not confirmed     Medication List    TAKE these medications        cyclobenzaprine 10 MG tablet  Commonly known as:  FLEXERIL  Take 1 tablet (10 mg total) by mouth 3 (three) times daily as needed for muscle spasms.     HYDROcodone-acetaminophen 5-325 MG per tablet  Commonly known as:  NORCO/VICODIN  Take 1-2 tablets by mouth every 4 (four) hours as needed for moderate pain.         Signed: Freeman Borba A 08/04/2014, 9:58 AM

## 2014-08-04 NOTE — Progress Notes (Signed)
Patient alert and oriented, mae's well, voiding adequate amount of urine, swallowing without difficulty, no c/o pain. Patient discharged home with family. Script and discharged instructions given to patient. Patient and family stated understanding of d/c instructions given and has an appointment with MD. Aisha Ima Hafner RN 

## 2014-08-04 NOTE — Discharge Instructions (Signed)

## 2016-02-21 IMAGING — RF DG C-ARM 61-120 MIN
1 series · 1 of 1 positions shown · non-contrast
Comparison: None.

CLINICAL DATA: Anterior spinal fusion.

EXAM:
DG C-ARM 61-120 MIN; DG CERVICAL SPINE - 1 VIEW

[Series 1: run · 1 of 1 slices shown]
[im 1/1]
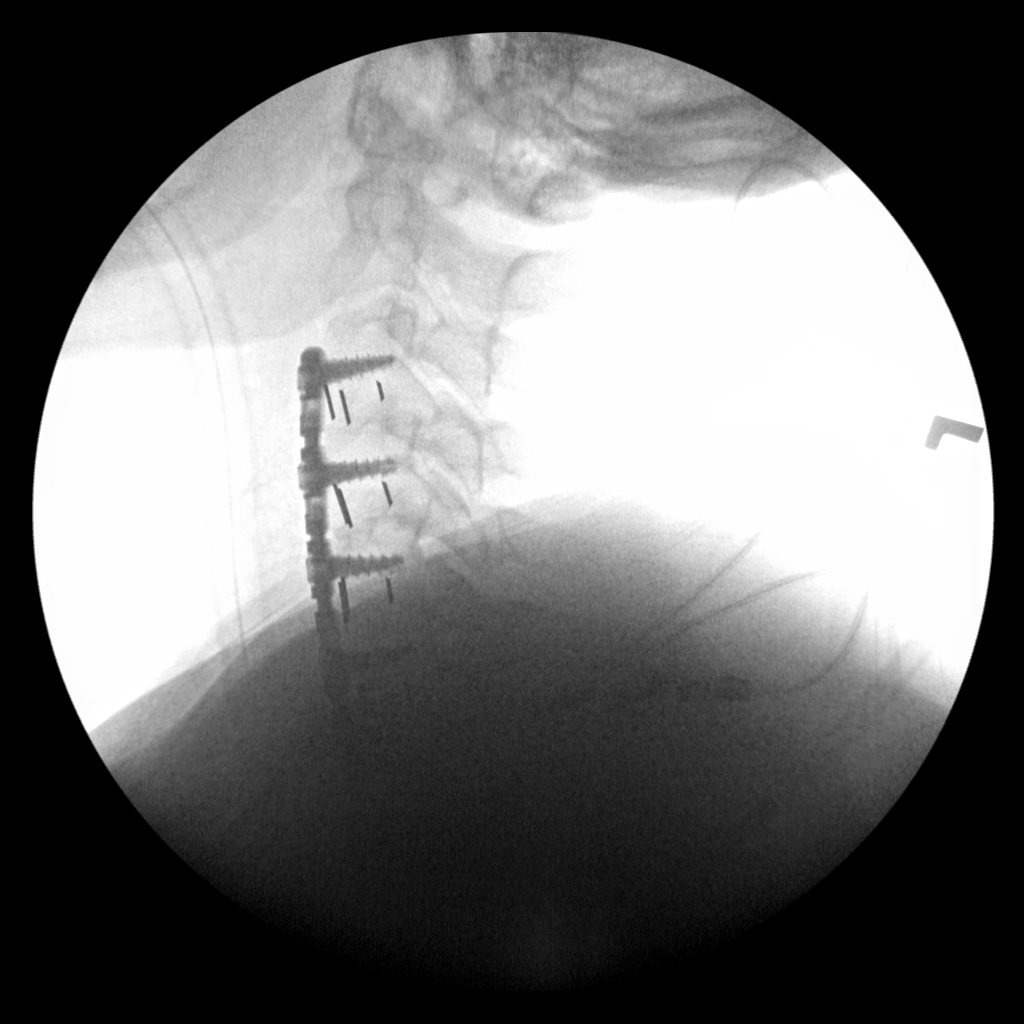

[1 of 1 positions shown; findings below may reference images not displayed]

FINDINGS: Single lateral view demonstrates an endotracheal tube. Patient has
undergone anterior plate and screw fixation with interbody devices
at C3-C7. Bone detail is limited.
IMPRESSION: Anterior cervical spine fusion.
